# Patient Record
Sex: Male | Born: 1970 | Race: Black or African American | Hispanic: No | Marital: Single | Smoking: Current every day smoker
Health system: Southern US, Community
[De-identification: ages and names within clinical notes are randomized; demographics above are authoritative.]

## PROBLEM LIST (undated history)

## (undated) DIAGNOSIS — E785 Hyperlipidemia, unspecified: Secondary | ICD-10-CM

## (undated) DIAGNOSIS — I639 Cerebral infarction, unspecified: Secondary | ICD-10-CM

## (undated) DIAGNOSIS — I10 Essential (primary) hypertension: Secondary | ICD-10-CM

## (undated) HISTORY — PX: GANGLION CYST EXCISION: SHX1691

---

## 2017-03-13 ENCOUNTER — Emergency Department: Payer: Self-pay

## 2017-03-13 ENCOUNTER — Emergency Department
Admission: EM | Admit: 2017-03-13 | Discharge: 2017-03-13 | Disposition: A | Discharge status: Home / Self Care | Payer: Self-pay | Attending: Emergency Medicine | Admitting: Emergency Medicine

## 2017-03-13 ENCOUNTER — Encounter: Payer: Self-pay | Admitting: Emergency Medicine

## 2017-03-13 DIAGNOSIS — R200 Anesthesia of skin: Secondary | ICD-10-CM | POA: Insufficient documentation

## 2017-03-13 DIAGNOSIS — F17210 Nicotine dependence, cigarettes, uncomplicated: Secondary | ICD-10-CM | POA: Insufficient documentation

## 2017-03-13 DIAGNOSIS — Z79899 Other long term (current) drug therapy: Secondary | ICD-10-CM | POA: Insufficient documentation

## 2017-03-13 DIAGNOSIS — I10 Essential (primary) hypertension: Secondary | ICD-10-CM | POA: Insufficient documentation

## 2017-03-13 DIAGNOSIS — Z7982 Long term (current) use of aspirin: Secondary | ICD-10-CM | POA: Insufficient documentation

## 2017-03-13 DIAGNOSIS — Z7902 Long term (current) use of antithrombotics/antiplatelets: Secondary | ICD-10-CM | POA: Insufficient documentation

## 2017-03-13 DIAGNOSIS — Z8673 Personal history of transient ischemic attack (TIA), and cerebral infarction without residual deficits: Secondary | ICD-10-CM | POA: Insufficient documentation

## 2017-03-13 HISTORY — DX: Essential (primary) hypertension: I10

## 2017-03-13 HISTORY — DX: Hyperlipidemia, unspecified: E78.5

## 2017-03-13 HISTORY — DX: Cerebral infarction, unspecified: I63.9

## 2017-03-13 LAB — COMPREHENSIVE METABOLIC PANEL
ALT: 17 U/L (ref 17–63)
ANION GAP: 9 (ref 5–15)
AST: 20 U/L (ref 15–41)
Albumin: 3.9 g/dL (ref 3.5–5.0)
Alkaline Phosphatase: 77 U/L (ref 38–126)
BUN: 18 mg/dL (ref 6–20)
CHLORIDE: 103 mmol/L (ref 101–111)
CO2: 27 mmol/L (ref 22–32)
Calcium: 9.1 mg/dL (ref 8.9–10.3)
Creatinine, Ser: 1.33 mg/dL — ABNORMAL HIGH (ref 0.61–1.24)
GFR calc non Af Amer: >60 mL/min (ref >60)
Glucose, Bld: 118 mg/dL — ABNORMAL HIGH (ref 65–99)
POTASSIUM: 3.5 mmol/L (ref 3.5–5.1)
SODIUM: 139 mmol/L (ref 135–145)
Total Bilirubin: 0.5 mg/dL (ref 0.3–1.2)
Total Protein: 7.8 g/dL (ref 6.5–8.1)

## 2017-03-13 LAB — DIFFERENTIAL
BASOS PCT: 1 %
Basophils Absolute: 0.1 10*3/uL (ref 0–0.1)
EOS PCT: 2 %
Eosinophils Absolute: 0.1 10*3/uL (ref 0–0.7)
Lymphocytes Relative: 34 %
Lymphs Abs: 2.6 10*3/uL (ref 1.0–3.6)
MONO ABS: 0.4 10*3/uL (ref 0.2–1.0)
Monocytes Relative: 6 %
NEUTROS PCT: 57 %
Neutro Abs: 4.4 10*3/uL (ref 1.4–6.5)

## 2017-03-13 LAB — PROTIME-INR
INR: 0.96
PROTHROMBIN TIME: 12.7 s (ref 11.4–15.2)

## 2017-03-13 LAB — CBC
HCT: 38.6 % — ABNORMAL LOW (ref 40.0–52.0)
Hemoglobin: 12.3 g/dL — ABNORMAL LOW (ref 13.0–18.0)
MCH: 22.7 pg — AB (ref 26.0–34.0)
MCHC: 31.8 g/dL — ABNORMAL LOW (ref 32.0–36.0)
MCV: 71.2 fL — AB (ref 80.0–100.0)
PLATELETS: 232 10*3/uL (ref 150–440)
RBC: 5.43 MIL/uL (ref 4.40–5.90)
RDW: 15 % — AB (ref 11.5–14.5)
WBC: 7.7 10*3/uL (ref 3.8–10.6)

## 2017-03-13 LAB — APTT: aPTT: 30 seconds (ref 24–36)

## 2017-03-13 LAB — TROPONIN I: Troponin I: <0.03 ng/mL (ref <0.03)

## 2017-03-13 MED ORDER — ASPIRIN 81 MG PO CHEW: 81.0000 mg | CHEWABLE_TABLET | Freq: Every day | ORAL | 0 refills | Status: AC

## 2017-03-13 MED ORDER — ASPIRIN 81 MG PO CHEW
81.0000 mg | CHEWABLE_TABLET | Freq: Once | ORAL | Status: AC
  Administered 2017-03-13: 81 mg via ORAL
  Filled 2017-03-13: qty 1

## 2017-03-13 NOTE — ED Notes (Signed)
First Nurse Note:  Patient presents to the ED with right sided weakness x 2 weeks.  Patient reports history of TIAs and Strokes and states last known stroke was 1 month ago but patient did not have right sided weakness at that time.  Patient ambulatory to registration with slight limp.

## 2017-03-13 NOTE — Discharge Instructions (Signed)
Return to the emergency department immediately if you develop new, worsening, or recurrent numbness, weakness, vision changes, severe headache, confusion or change in mental status, or any other new or worsening symptoms that concern you.  You also may return at any time if you change your mind and wish to complete the MRI and further workup in the emergency room.  With the CT scan done today we cannot completely rule out a new or worsening stroke, so it is very important you return if you have any symptoms that concern you.  Take the aspirin as prescribed until you follow-up with your neurologist.  Call your neurologist and primary care doctor to arrange a follow-up soon as possible next week.

## 2017-03-13 NOTE — ED Notes (Signed)
Pt presents with right-sided numbness x 2.5 weeks. Pt states he gets numb every time he stands up. Pt also reports painful spot on left calf, which is warm to touch. Pedal pulses intact. Pt has hx of TIA and CVA. Pt alert & oriented with NAD noted.

## 2017-03-13 NOTE — ED Notes (Signed)
ED Provider at bedside. 

## 2017-03-13 NOTE — ED Notes (Signed)
[facility] in [facility].

## 2017-03-13 NOTE — ED Provider Notes (Signed)
[facility] Emergency Department Provider Note ____________________________________________   First MD Initiated Contact with Patient 03/13/17 1524     (approximate)  I have reviewed the triage vital signs and the nursing notes.   HISTORY  Chief Complaint Numbness    HPI Adam Rowe is a 46 y.o. male with history of hypertension, hyperlipidemia, and CVA who presents with right-sided numbness over the last several weeks, intermittent course, but described as mainly occurring in the leg, but sometimes also in his right hand and the right side of his face, and improved in the last 1-2 days.  Patient denies associated headache or lightheadedness.  Patient also reports a lesion to the left lower leg which was swollen and red, and now is firm and slightly discolored compared to the surrounding skin.  He denies rash or redness going up the leg, fever chills, joint pain or swelling, swelling elsewhere in the leg, difficulty breathing or chest pain.  Patient states that several months ago he passed out and was told that he had a stroke, and has been following up with a neurologist in [facility] where he is from.  His next appointment is scheduled for January.  He stated that he called his primary care doctor to describe his symptoms and was told to come to the emergency department for evaluation.  Past Medical History:  Diagnosis Date  . Hyperlipidemia   . Hypertension   . Stroke [facility])     There are no active problems to display for this patient.   Past Surgical History:  Procedure Laterality Date  . GANGLION CYST EXCISION      Prior to Admission medications   Medication Sig Start Date End Date Taking? Authorizing Provider  amLODipine (NORVASC) 10 MG tablet Take 1 tablet by mouth daily. 01/07/17  Yes [provider]  atorvastatin (LIPITOR) 40 MG tablet Take 1 tablet by mouth daily. 01/07/17  Yes [provider]  clopidogrel (PLAVIX)  75 MG tablet Take 1 tablet by mouth daily. 01/16/17  Yes [provider]  folic acid (FOLVITE) 1 MG tablet Take 1 tablet by mouth daily. 12/22/16  Yes [provider]  lisinopril-hydrochlorothiazide (PRINZIDE,ZESTORETIC) 20-25 MG tablet Take 1 tablet by mouth daily.   Yes [provider]  aspirin 81 MG chewable tablet Chew 1 tablet (81 mg total) by mouth daily. 03/13/17   Dionne Bucy, MD    Allergies Patient has no known allergies.  No family history on file.  Social History Social History   Tobacco Use  . Smoking status: Current Every Day Smoker    Packs/day: 0.50    Types: Cigarettes  . Smokeless tobacco: Never Used  Substance Use Topics  . Alcohol use: Yes  . Drug use: Yes    Types: Marijuana    Review of Systems  Constitutional: No fever. Eyes: No visual changes. ENT: No sore throat. Cardiovascular: Denies chest pain. Respiratory: Denies shortness of breath. Gastrointestinal: No nausea, no vomiting.  No diarrhea.  Genitourinary: Negative for dysuria.  Musculoskeletal: Negative for back pain. Skin: Negative for rash. Neurological: Negative for headaches.  Negative for focal weakness; positive for numbness.   ____________________________________________   PHYSICAL EXAM:  VITAL SIGNS: ED Triage Vitals  Enc Vitals Group     BP 03/13/17 1242 (!) 154/86     Pulse Rate 03/13/17 1242 63     Resp 03/13/17 1242 18     Temp 03/13/17 1242 98 F (36.7 C)     Temp Source  03/13/17 1242 Oral     SpO2 03/13/17 1242 99 %     Weight 03/13/17 1243 245 lb (111.1 kg)     Height 03/13/17 1243 5\' 10"  (1.778 m)     Head Circumference --      Peak Flow --      Pain Score 03/13/17 1242 6     Pain Loc --      Pain Edu? --      Excl. in GC? --     Constitutional: Alert and oriented. Well appearing and in no acute distress. Eyes: Conjunctivae are normal.  EOMI.  PERRLA.   Head: Atraumatic. Nose: No congestion/rhinnorhea. Mouth/Throat: Mucous  membranes are moist.   Neck: Normal range of motion.  Cardiovascular:  Good peripheral circulation. Respiratory: Normal respiratory effort.  No retractions.  Gastrointestinal: No distention.  Genitourinary: No CVA tenderness. Musculoskeletal: No lower extremity edema.  Extremities warm and well perfused. LLE with approx 3cm slightly discolored superficial area of skin to the medial distal lower leg, with no erythema, induration, warmth or tenderness.  No calf, popliteal or thigh tenderness or swelling.  2+ DP pulse and intact distal motor and fine touch.  Neurologic:  Normal speech and language. Motor 5/5 in all extremities.  Mild subjective dec sensation to R maxilla, CNs III-XII otherwise intact.  Normal coordination.   Skin:  Skin is warm and dry. No rash noted. Psychiatric: Mood and affect are normal. Speech and behavior are normal.  ____________________________________________   LABS (all labs ordered are listed, but only abnormal results are displayed)  Labs Reviewed  CBC - Abnormal; Notable for the following components:      Result Value   Hemoglobin 12.3 (*)    HCT 38.6 (*)    MCV 71.2 (*)    MCH 22.7 (*)    MCHC 31.8 (*)    RDW 15.0 (*)    All other components within normal limits  COMPREHENSIVE METABOLIC PANEL - Abnormal; Notable for the following components:   Glucose, Bld 118 (*)    Creatinine, Ser 1.33 (*)    All other components within normal limits  PROTIME-INR  APTT  DIFFERENTIAL  TROPONIN I   ____________________________________________  EKG  ED ECG REPORT I, Dionne BucySebastian Giovonnie Trettel, the attending physician, personally viewed and interpreted this ECG.  Date: 03/13/2017 EKG Time: 1248 Rate: 69 Rhythm: normal sinus rhythm QRS Axis: normal Intervals: normal ST/T Wave abnormalities: normal Narrative Interpretation: no evidence of acute ischemia; no old EKG available for comparison  ____________________________________________  RADIOLOGY  CT head: no ICH,  or acute or subacute ischemic disease  ____________________________________________   PROCEDURES  Procedure(s) performed: No    Critical Care performed: No ____________________________________________   INITIAL IMPRESSION / ASSESSMENT AND PLAN / ED COURSE  Pertinent labs & imaging results that were available during my care of the patient were reviewed by me and considered in my medical decision making (see chart for details).    Clinical Course as of Mar 13 2028  Wed Mar 13, 2017  1526 Creatinine: (!) 1.33 [SS]    Clinical Course User Index [SS] Dionne BucySiadecki, Renna Kilmer, MD   46 year old male with history of hypertension, hyperlipidemia, CVA presents with intermittent right-sided numbness over approximately the last 2 weeks, somewhat improved in the last 2 days.  No headache or other associated symptoms.  On exam, vital signs are normal, patient is extremely comfortable appearing, and the remainder the exam is unremarkable except for very mild subjective decreased sensation in the right mid face.  There are no other acute neurologic findings.  I reviewed patient's past medical records in care everywhere including neurology note from September of this year when he was evaluated after his CVA.  The note included the following assessment and plan:  IMPRESSION: #1 Cerebrovascular Disease: Left ACA involving corpus callosum, cingulate gyrus and frontoparietal. Chronic microvascular changes. Risk factors including hypertension, tobacco abuse and cocaine abuse. Factor 5 Deficiency. 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 #4 Cocaine Abuse: The cocaine may be causing a vasculitis leading to repeat ischemia. #5 Tobacco Abuse: Discussed cessation #6 Probable Vasovagal Syncope #7 Neck pain: Secondary arthritis   Overall, on this presentation I have low suspicion for  recurrent acute CVA given the subacute and intermittent nature of the symptoms, and the presence of similar symptoms previously.  We obtained CT which was negative for acute findings, and lab workup is unremarkable.  I consulted Dr. Thad Rangereynolds from neurology to discuss the plan of care.  She recommended MRI for further evaluation, and if no acute changes, plan for discharge and add aspirin to patient's antiplatelet regimen.  However states that he feels well and does not want to stay for an MRI.  He states that he would like to go home, and follow-up with his regular neurologist in [facility] next week.  I explained to the patient's that that based on the workup done up to now, we cannot completely rule out acute or subacute stroke which could cause permanent disability, or death.  I explained that the overall likelihood of this is relatively low, but we cannot rule this out with out the MRI that was recommended by the neurologist.  Patient was able to paraphrase these risks back to me, expresses understanding, and states that he does not want this additional workup at this time.  He understands that he may return at any time if he changes his mind.  He also understands that he should return for any new, worsening, or recurrent symptoms.  In terms of the leg lesion, there is no evidence of DVT.  It appears consistent with a healed small cellulitis or dermatitis.  No indication for US or other workup or treatment at this time.  Return precautions given.   Patient demonstrates adequate understanding of risks and benefits; will proceed with discharge, with the addition of ASA as recommended by neuro.       ____________________________________________   FINAL CLINICAL IMPRESSION(S) / ED DIAGNOSES  Final diagnoses:  Numbness      NEW MEDICATIONS STARTED DURING THIS VISIT:  This SmartLink is deprecated. Use AVSMEDLIST instead to display the medication list for a patient.   Note:  This  document was prepared using Dragon voice recognition software and may include unintentional dictation errors.    Dionne BucySiadecki, Ricco Dershem, MD 03/13/17 2030

## 2017-03-13 NOTE — ED Notes (Addendum)
[facility]  Pt alert. Pt talking on cell phone.  Sinus on monitor at 65.  Pt denies any pain at this time.  No numbness in right leg.  Pt waiting to see er md

## 2017-03-13 NOTE — ED Notes (Signed)
Pt states he feels "normal" and that his numbness has resolved.

## 2017-03-13 NOTE — ED Triage Notes (Signed)
Pt comes into the ED via POV c/o nubbness on the right side that started 2 weeks ago.  Patient has h/o strokes in the past.  Patient also concerned for possible blood clot in the left leg.  States it is swollen, and warm to the touch.  Patient in NAD at this time with even and unlabored respirations and is ambulatory to triage at this time.  Patient denies any chest pain, dizziness, or increased weakness on on side.

## 2018-12-04 IMAGING — CT CT HEAD W/O CM
3 series · 16 of 47 positions shown, 19 images · non-contrast
Comparison: None.

CLINICAL DATA: Right-sided numbness for 2 weeks

EXAM:
CT HEAD WITHOUT CONTRAST
TECHNIQUE: Contiguous axial images were obtained from the base of the skull
through the vertex without intravenous contrast.

[Series 3: head wo · axial · 0.47mm/px · z∈[-78,+62]mm · 10 of 34 slices shown, 13 images]
[im 3/34  brain]
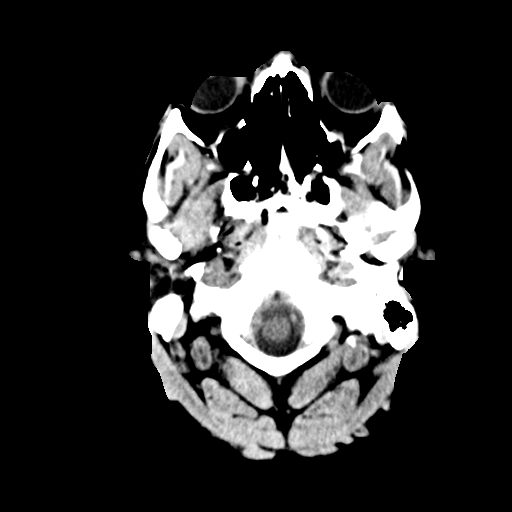
[im 3/34  bone]
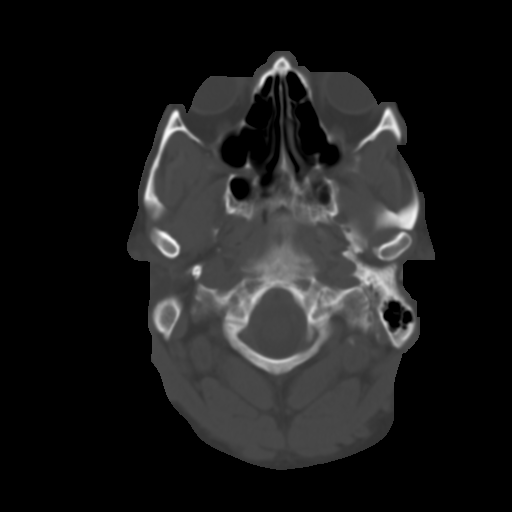
[im 6/34  brain]
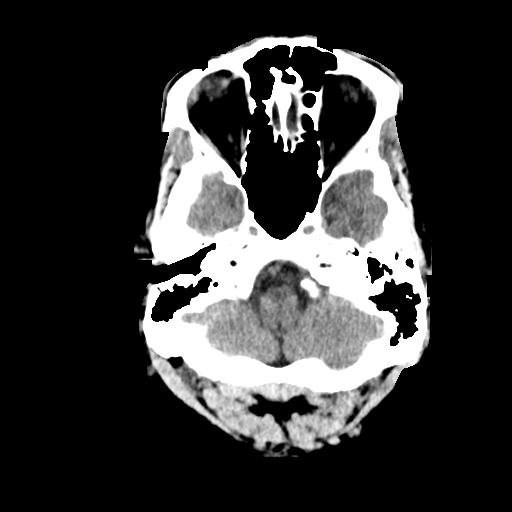
[im 10/34  brain]
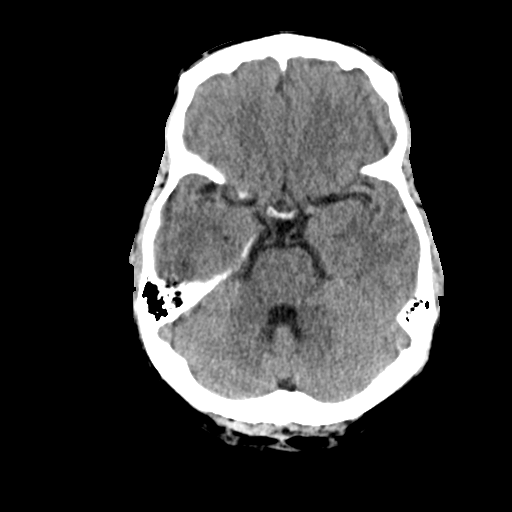
[im 12/34  brain]
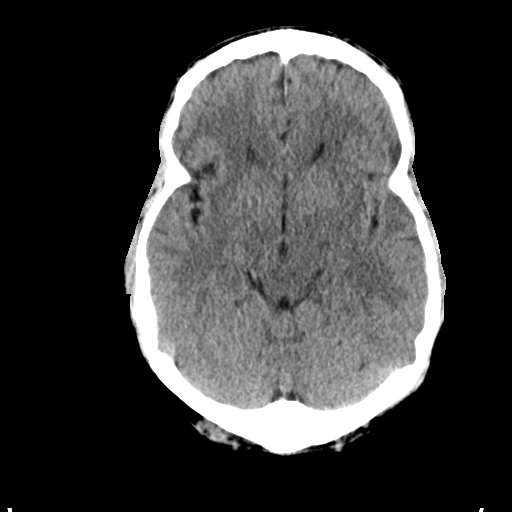
[im 15/34  brain]
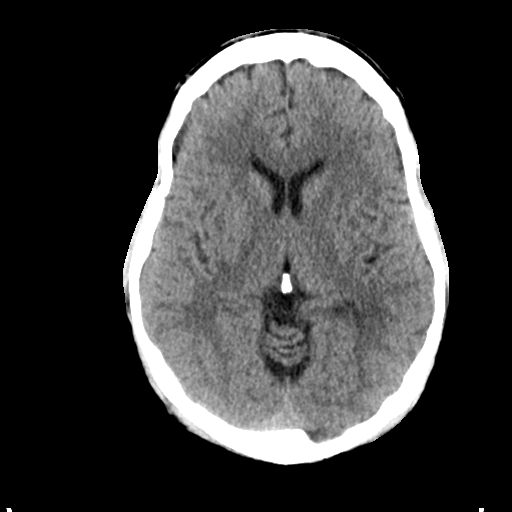
[im 15/34  bone]
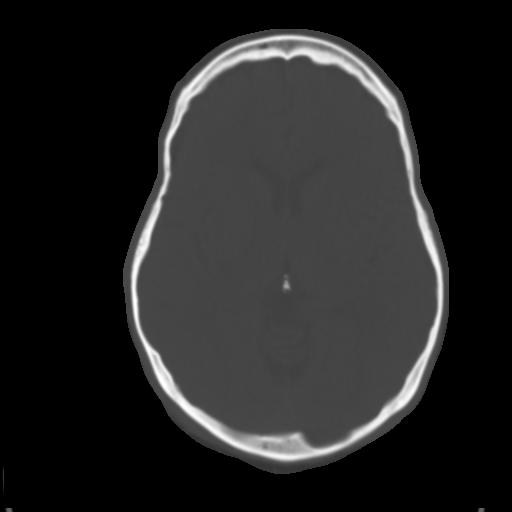
[im 19/34  brain]
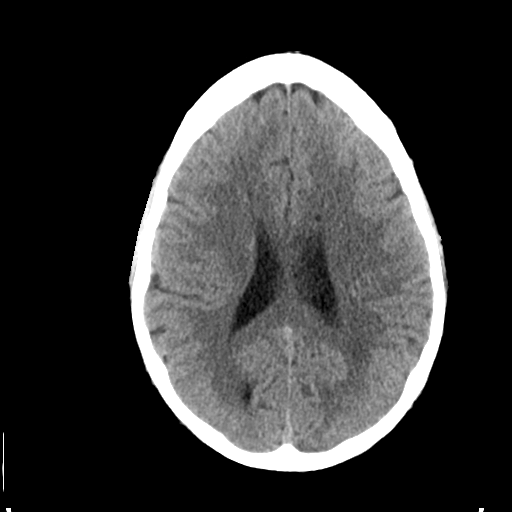
[im 22/34  brain]
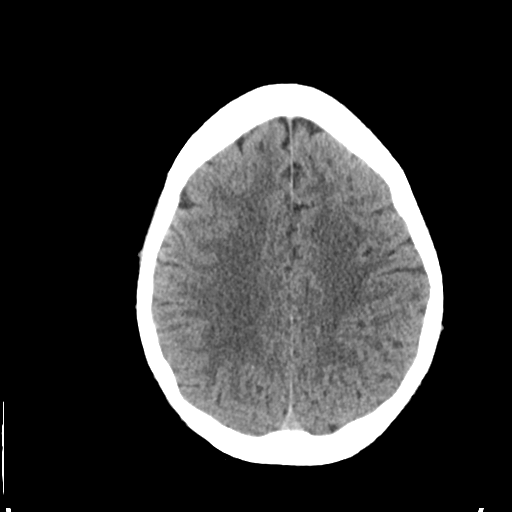
[im 26/34  brain]
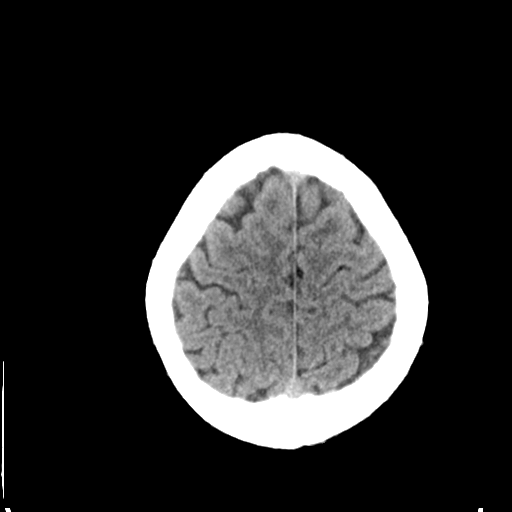
[im 28/34  brain]
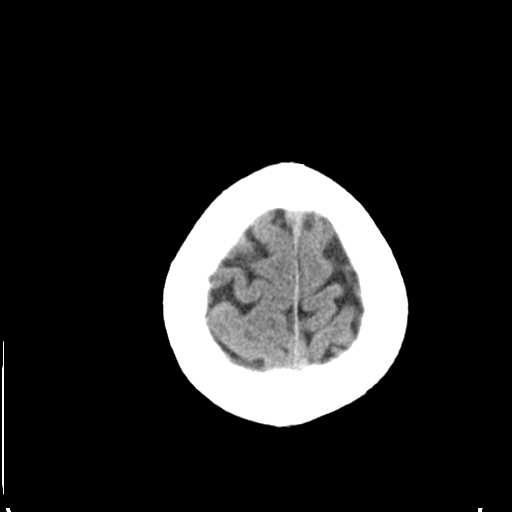
[im 28/34  bone]
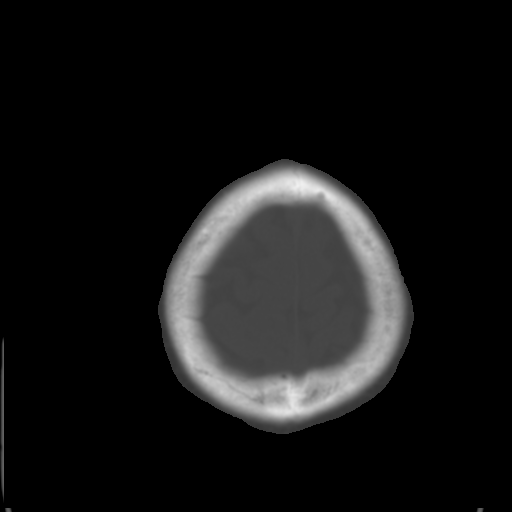
[im 31/34  brain]
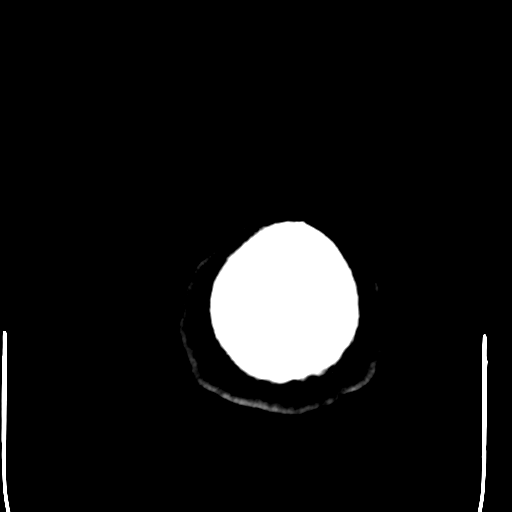

[Series 4: coronal soft tissue · coronal · 0.32mm/px · 3 of 71 slices shown]
[im 24/71  brain]
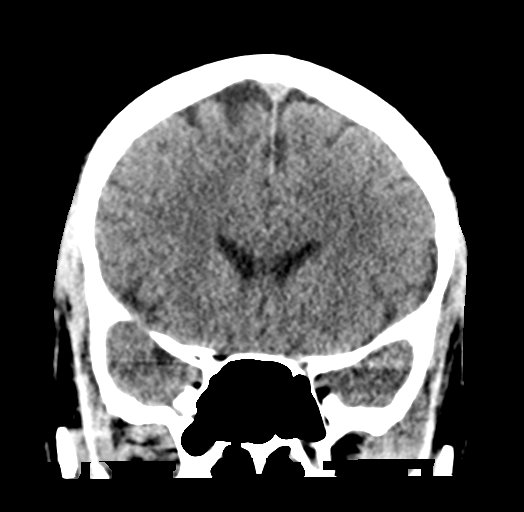
[im 32/71  brain]
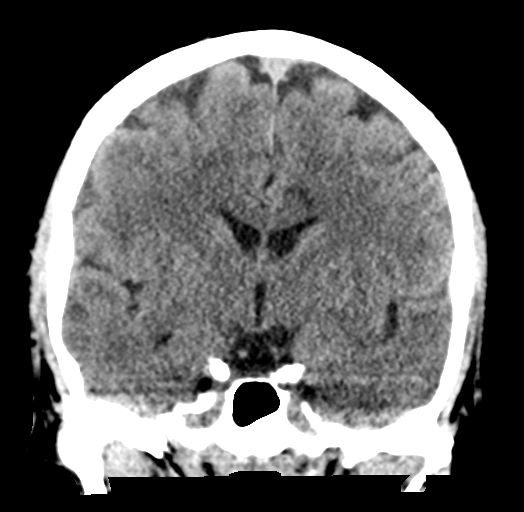
[im 39/71  brain]
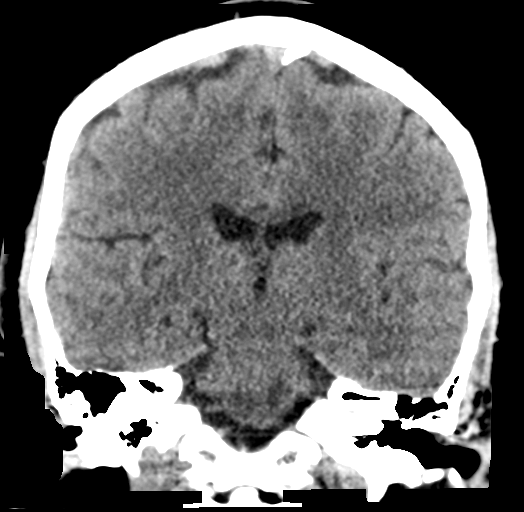

[Series 5: sagittal soft tissue · sagittal · 0.33mm/px · 3 of 56 slices shown]
[im 19/56  brain]
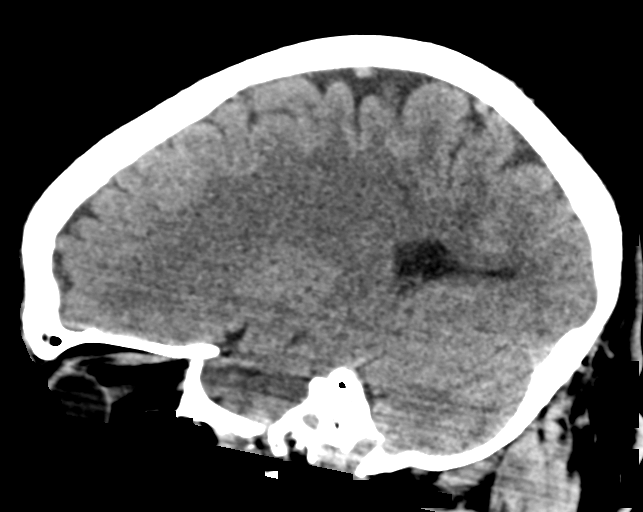
[im 28/56  brain]
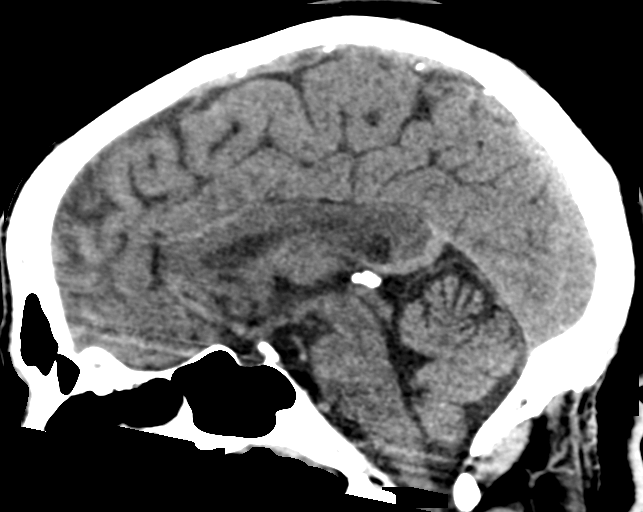
[im 37/56  brain]
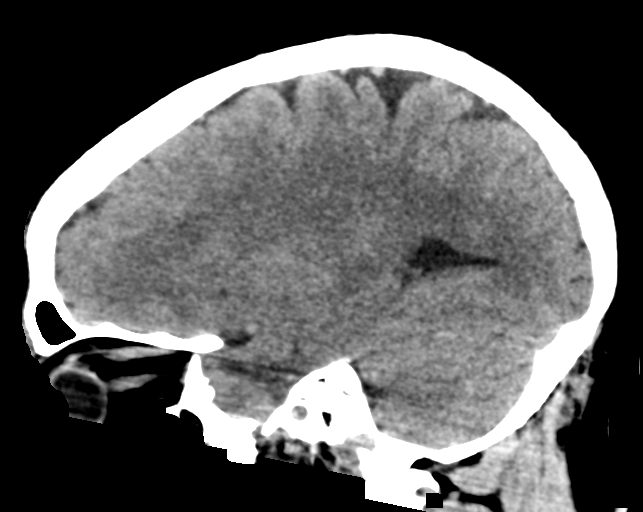

[16 of 47 positions shown; findings below may reference images not displayed]

FINDINGS: Brain: The ventricles are normal in size and configuration. There is
no intracranial mass, hemorrhage, extra-axial fluid collection, or
midline shift. There is slight decreased attenuation in the centra
semiovale bilaterally, felt to represent small vessel disease.
Elsewhere gray-white compartments are normal. No acute appearing
infarct evident.

Vascular: No hyperdense vessel evident. There is no appreciable
vascular calcification.

Skull: Bony calvarium appears intact.

Sinuses/Orbits: Visualized paranasal sinuses are clear. Visualized
orbits appear symmetric bilaterally.

Other: Mastoid air cells are clear.
IMPRESSION: Slight periventricular small vessel disease. No intracranial mass,
hemorrhage, or extra-axial fluid collection. No acute infarct
evident.
